# Patient Record
Sex: Female | Born: 1935 | Race: White | Hispanic: No | Marital: Married
Health system: Southern US, Community
[De-identification: ages and names within clinical notes are randomized; demographics above are authoritative.]

---

## 2006-03-10 ENCOUNTER — Ambulatory Visit: Payer: Self-pay | Admitting: Internal Medicine

## 2007-10-03 ENCOUNTER — Inpatient Hospital Stay: Payer: Self-pay | Admitting: Internal Medicine

## 2007-10-03 ENCOUNTER — Other Ambulatory Visit: Payer: Self-pay

## 2009-11-14 ENCOUNTER — Ambulatory Visit: Payer: Self-pay | Admitting: Unknown Physician Specialty

## 2014-09-26 ENCOUNTER — Inpatient Hospital Stay: Payer: Self-pay | Admitting: Internal Medicine

## 2014-09-26 LAB — PROTIME-INR
INR: 1
PROTHROMBIN TIME: 13.1 s (ref 11.5–14.7)

## 2014-09-26 LAB — COMPREHENSIVE METABOLIC PANEL
ANION GAP: 11 (ref 7–16)
Albumin: 2.3 g/dL — ABNORMAL LOW (ref 3.4–5.0)
Alkaline Phosphatase: 85 U/L (ref 46–116)
BUN: 19 mg/dL — AB (ref 7–18)
Bilirubin,Total: 1.2 mg/dL — ABNORMAL HIGH (ref 0.2–1.0)
CREATININE: 1.57 mg/dL — AB (ref 0.60–1.30)
Calcium, Total: 8.2 mg/dL — ABNORMAL LOW (ref 8.5–10.1)
Chloride: 97 mmol/L — ABNORMAL LOW (ref 98–107)
Co2: 28 mmol/L (ref 21–32)
EGFR (African American): 40 — ABNORMAL LOW
EGFR (Non-African Amer.): 33 — ABNORMAL LOW
GLUCOSE: 125 mg/dL — AB (ref 65–99)
OSMOLALITY: 276 (ref 275–301)
Potassium: 3.1 mmol/L — ABNORMAL LOW (ref 3.5–5.1)
SGOT(AST): 24 U/L (ref 15–37)
SGPT (ALT): 14 U/L (ref 14–63)
SODIUM: 136 mmol/L (ref 136–145)
TOTAL PROTEIN: 5.9 g/dL — AB (ref 6.4–8.2)

## 2014-09-26 LAB — URINALYSIS, COMPLETE
BILIRUBIN, UR: NEGATIVE
Blood: NEGATIVE
Glucose,UR: NEGATIVE mg/dL (ref 0–75)
Hyaline Cast: 8
KETONE: NEGATIVE
LEUKOCYTE ESTERASE: NEGATIVE
Nitrite: NEGATIVE
Ph: 5 (ref 4.5–8.0)
Protein: NEGATIVE
RBC,UR: 2 /HPF (ref 0–5)
SPECIFIC GRAVITY: 1.02 (ref 1.003–1.030)
Squamous Epithelial: <1

## 2014-09-26 LAB — CBC WITH DIFFERENTIAL/PLATELET
Basophil #: 0 10*3/uL (ref 0.0–0.1)
Basophil %: 0.4 %
Eosinophil #: 0 10*3/uL (ref 0.0–0.7)
Eosinophil %: 0.3 %
HCT: 33.7 % — AB (ref 35.0–47.0)
HGB: 10.6 g/dL — ABNORMAL LOW (ref 12.0–16.0)
Lymphocyte #: 0.7 10*3/uL — ABNORMAL LOW (ref 1.0–3.6)
Lymphocyte %: 9.6 %
MCH: 22.1 pg — AB (ref 26.0–34.0)
MCHC: 31.6 g/dL — ABNORMAL LOW (ref 32.0–36.0)
MCV: 70 fL — AB (ref 80–100)
MONO ABS: 0.4 x10 3/mm (ref 0.2–0.9)
Monocyte %: 5.7 %
Neutrophil #: 6.4 10*3/uL (ref 1.4–6.5)
Neutrophil %: 84 %
PLATELETS: 285 10*3/uL (ref 150–440)
RBC: 4.81 10*6/uL (ref 3.80–5.20)
RDW: 20 % — ABNORMAL HIGH (ref 11.5–14.5)
WBC: 7.6 10*3/uL (ref 3.6–11.0)

## 2014-09-26 LAB — APTT: Activated PTT: 23.9 secs (ref 23.6–35.9)

## 2014-09-27 LAB — CBC WITH DIFFERENTIAL/PLATELET
Basophil #: 0 10*3/uL (ref 0.0–0.1)
Basophil %: 0.4 %
Eosinophil #: 0 10*3/uL (ref 0.0–0.7)
Eosinophil %: 0.6 %
HCT: 26.4 % — AB (ref 35.0–47.0)
HGB: 8.3 g/dL — AB (ref 12.0–16.0)
LYMPHS PCT: 9.3 %
Lymphocyte #: 0.7 10*3/uL — ABNORMAL LOW (ref 1.0–3.6)
MCH: 22.2 pg — AB (ref 26.0–34.0)
MCHC: 31.4 g/dL — ABNORMAL LOW (ref 32.0–36.0)
MCV: 71 fL — ABNORMAL LOW (ref 80–100)
MONO ABS: 0.3 x10 3/mm (ref 0.2–0.9)
Monocyte %: 3.5 %
NEUTROS ABS: 6.2 10*3/uL (ref 1.4–6.5)
Neutrophil %: 86.2 %
Platelet: 245 10*3/uL (ref 150–440)
RBC: 3.74 10*6/uL — ABNORMAL LOW (ref 3.80–5.20)
RDW: 20.2 % — ABNORMAL HIGH (ref 11.5–14.5)
WBC: 7.2 10*3/uL (ref 3.6–11.0)

## 2014-09-27 LAB — BASIC METABOLIC PANEL
ANION GAP: 10 (ref 7–16)
BUN: 24 mg/dL — ABNORMAL HIGH (ref 7–18)
CHLORIDE: 107 mmol/L (ref 98–107)
Calcium, Total: 7.3 mg/dL — ABNORMAL LOW (ref 8.5–10.1)
Co2: 25 mmol/L (ref 21–32)
Creatinine: 1.91 mg/dL — ABNORMAL HIGH (ref 0.60–1.30)
EGFR (Non-African Amer.): 27 — ABNORMAL LOW
GFR CALC AF AMER: 32 — AB
Glucose: 85 mg/dL (ref 65–99)
OSMOLALITY: 286 (ref 275–301)
Potassium: 3 mmol/L — ABNORMAL LOW (ref 3.5–5.1)
Sodium: 142 mmol/L (ref 136–145)

## 2014-09-28 LAB — BASIC METABOLIC PANEL
Anion Gap: 11 (ref 7–16)
BUN: 24 mg/dL — AB (ref 7–18)
CALCIUM: 7.5 mg/dL — AB (ref 8.5–10.1)
Chloride: 115 mmol/L — ABNORMAL HIGH (ref 98–107)
Co2: 19 mmol/L — ABNORMAL LOW (ref 21–32)
Creatinine: 1.51 mg/dL — ABNORMAL HIGH (ref 0.60–1.30)
EGFR (African American): 42 — ABNORMAL LOW
EGFR (Non-African Amer.): 35 — ABNORMAL LOW
GLUCOSE: 86 mg/dL (ref 65–99)
OSMOLALITY: 292 (ref 275–301)
Potassium: 3.6 mmol/L (ref 3.5–5.1)
Sodium: 145 mmol/L (ref 136–145)

## 2014-09-28 LAB — CBC WITH DIFFERENTIAL/PLATELET
BASOS ABS: 0 10*3/uL (ref 0.0–0.1)
Basophil %: 0.3 %
Eosinophil #: 0 10*3/uL (ref 0.0–0.7)
Eosinophil %: 0.1 %
HCT: 23.2 % — ABNORMAL LOW (ref 35.0–47.0)
HGB: 7.1 g/dL — ABNORMAL LOW (ref 12.0–16.0)
LYMPHS PCT: 4.6 %
Lymphocyte #: 0.3 10*3/uL — ABNORMAL LOW (ref 1.0–3.6)
MCH: 22.1 pg — ABNORMAL LOW (ref 26.0–34.0)
MCHC: 30.8 g/dL — AB (ref 32.0–36.0)
MCV: 72 fL — ABNORMAL LOW (ref 80–100)
MONO ABS: 0.2 x10 3/mm (ref 0.2–0.9)
MONOS PCT: 3.1 %
NEUTROS PCT: 91.9 %
Neutrophil #: 5.3 10*3/uL (ref 1.4–6.5)
Platelet: 263 10*3/uL (ref 150–440)
RBC: 3.23 10*6/uL — ABNORMAL LOW (ref 3.80–5.20)
RDW: 20.8 % — AB (ref 11.5–14.5)
WBC: 5.7 10*3/uL (ref 3.6–11.0)

## 2014-09-29 LAB — BASIC METABOLIC PANEL
Anion Gap: 9 (ref 7–16)
BUN: 28 mg/dL — AB (ref 7–18)
CALCIUM: 7.9 mg/dL — AB (ref 8.5–10.1)
CHLORIDE: 115 mmol/L — AB (ref 98–107)
CO2: 21 mmol/L (ref 21–32)
Creatinine: 1.85 mg/dL — ABNORMAL HIGH (ref 0.60–1.30)
EGFR (African American): 33 — ABNORMAL LOW
EGFR (Non-African Amer.): 28 — ABNORMAL LOW
Glucose: 81 mg/dL (ref 65–99)
OSMOLALITY: 293 (ref 275–301)
Potassium: 3.8 mmol/L (ref 3.5–5.1)
Sodium: 145 mmol/L (ref 136–145)

## 2014-09-29 LAB — CBC WITH DIFFERENTIAL/PLATELET
BANDS NEUTROPHIL: 2 %
HCT: 28.2 % — ABNORMAL LOW (ref 35.0–47.0)
HGB: 9.1 g/dL — ABNORMAL LOW (ref 12.0–16.0)
LYMPHS PCT: 9 %
MCH: 23.7 pg — ABNORMAL LOW (ref 26.0–34.0)
MCHC: 32.1 g/dL (ref 32.0–36.0)
MCV: 74 fL — ABNORMAL LOW (ref 80–100)
Monocytes: 1 %
NRBC/100 WBC: 8 /
PLATELETS: 257 10*3/uL (ref 150–440)
RBC: 3.82 10*6/uL (ref 3.80–5.20)
RDW: 20.9 % — AB (ref 11.5–14.5)
SEGMENTED NEUTROPHILS: 88 %
WBC: 5.6 10*3/uL (ref 3.6–11.0)

## 2014-09-30 LAB — CBC WITH DIFFERENTIAL/PLATELET
HCT: 28.7 % — ABNORMAL LOW (ref 35.0–47.0)
HGB: 9 g/dL — ABNORMAL LOW (ref 12.0–16.0)
Lymphocytes: 10 %
MCH: 23.6 pg — ABNORMAL LOW (ref 26.0–34.0)
MCHC: 31.4 g/dL — ABNORMAL LOW (ref 32.0–36.0)
MCV: 75 fL — ABNORMAL LOW (ref 80–100)
Monocytes: 3 %
NRBC/100 WBC: 5 /
Platelet: 285 10*3/uL (ref 150–440)
RBC: 3.83 10*6/uL (ref 3.80–5.20)
RDW: 21.7 % — ABNORMAL HIGH (ref 11.5–14.5)
Segmented Neutrophils: 87 %
WBC: 5.5 10*3/uL (ref 3.6–11.0)

## 2014-09-30 LAB — BASIC METABOLIC PANEL
Anion Gap: 13 (ref 7–16)
BUN: 34 mg/dL — ABNORMAL HIGH (ref 7–18)
CALCIUM: 7.9 mg/dL — AB (ref 8.5–10.1)
CHLORIDE: 118 mmol/L — AB (ref 98–107)
CO2: 17 mmol/L — AB (ref 21–32)
CREATININE: 1.65 mg/dL — AB (ref 0.60–1.30)
EGFR (African American): 38 — ABNORMAL LOW
EGFR (Non-African Amer.): 31 — ABNORMAL LOW
Glucose: 73 mg/dL (ref 65–99)
Osmolality: 300 (ref 275–301)
Potassium: 4.1 mmol/L (ref 3.5–5.1)
Sodium: 148 mmol/L — ABNORMAL HIGH (ref 136–145)

## 2014-09-30 LAB — CLOSTRIDIUM DIFFICILE(ARMC)

## 2014-10-01 ENCOUNTER — Ambulatory Visit: Payer: Self-pay | Admitting: Internal Medicine

## 2014-10-01 LAB — CREATININE, SERUM
Creatinine: 1.51 mg/dL — ABNORMAL HIGH (ref 0.60–1.30)
GFR CALC AF AMER: 42 — AB
GFR CALC NON AF AMER: 35 — AB

## 2014-10-30 ENCOUNTER — Ambulatory Visit
Admit: 2014-10-30 | Disposition: A | Discharge status: Home / Self Care | Payer: Self-pay | Attending: Internal Medicine | Admitting: Internal Medicine

## 2014-10-30 DEATH — deceased

## 2014-12-30 NOTE — Op Note (Signed)
PATIENT NAME:  Michelle GantDAMS, Teanna W MR#:  [id number] DATE OF BIRTH:  Apr 28, 1927  DATE OF PROCEDURE:  09/27/2014  PREOPERATIVE DIAGNOSIS: Right displaced intertrochanteric hip fracture.  POSTOPERATIVE DIAGNOSIS: Right displaced intertrochanteric hip fracture.   PROCEDURE: Intramedullary fixation of right intertrochanteric hip fracture.   ANESTHESIA: Spinal with 0.5% Marcaine local.   SURGEON: Juanell FairlyKevin Stacey Maura, MD   ESTIMATED BLOOD LOSS: 100 mL.   COMPLICATIONS: None.   IMPLANTS: A [employer] Affixus 11 x 180 mm, 125 degree short intramedullary rod with a 10.5 x 95 mm lag screw and a 5 x 38 mm cortical bone screw for distal fixation.   INDICATIONS FOR PROCEDURE: The patient is an 81 year old female with advanced dementia who sustained a fall from her couch. The fall was unwitnessed The family found her on the ground and when they tried to help her back onto the couch the patient had significant pain in the right lower extremity. She was brought to the [facility] where x-rays were taken. The patient was diagnosed with a displaced intertrochanteric hip fracture. The patient was reported by the family to be ambulatory at baseline.   I recommended intramedullary fixation, given the fracture pattern. I recommended to the family surgical fixation to help with the patient's pain control and to give her a chance to return to standing and ambulation. I reviewed the risks and benefits with the patient's son and her husband on the evening of admission. They understood the risks include infection, bleeding requiring blood transfusion, nerve or blood vessel injury, malunion, nonunion, hardware failure, painful hardware, change in lower extremity rotation or leg length discrepancy, persistent hip pain and the need for further surgery. Medical risks include, but are not limited to DVT and pulmonary embolism, myocardial infarction, stroke, respiratory failure and death. The patient's family  understood these risks and wished to proceed. Her son signed the consent form for surgery and for blood transfusion.   PROCEDURE NOTE: The patient had the right hip marked with the word "yes" and my initials, according to the hospital's correct site surgery protocol. She was brought to the operating room where she underwent a spinal anesthetic by the anesthesia service. She was then placed supine on the fracture table. All bony prominences were adequately padded. A timeout was performed to verify the patient's name, date of birth, medical record number, correct site of surgery and correct procedure to be performed. It was also used to verify the patient had received antibiotics and appropriate instruments, implants, and radiographic studies were available in the room. Once all in attendance were in agreement, the case began. Longitudinal traction and internal rotation was applied to the right leg, which was in a leg holder, with the hip in extension. The left leg had been placed in a well leg holder in a hemi-lithotomy position. The fracture reduction was confirmed on AP and lateral C-arm images. Once near-anatomic reduction was achieved, the patient was prepped and draped in a sterile fashion. A longitudinal incision just superior to the tip of the greater trochanter was then made in line with the femur. The deep fascia was incised with a deep #10 blade and the abductor muscle was bluntly split allowing for palpation of the tip of the greater trochanter. A threaded guide pin was then advanced into the tip of the greater trochanter and into the proximal femur to the level of the lesser trochanter. The position of this guidepin was confirmed on AP and lateral C-arm images. A soft tissue protector was used  as this guide pin was overdrilled with a proximal femoral drill. This allowed for placement of an Affixus 11 x 180 mm, 125 degree short intramedullary rod which was then advanced into position. Its position was  confirmed on AP and lateral C-arm images. A drill sleeve for the lag screw was then placed through the Affixus guide arm. A small incision was made to allow for advancement of the drill sleeve along the lateral cortex of the femur. A threaded guide pin was then advanced through the lateral cortex of the femur, across the fracture site and into the femoral head. Its position was confirmed on AP and lateral C-arm images. A tip apex distance of less than 25 mm was achieved. This was then overdrilled after the lag screw was measured to 95 mm. The lag screw drill was advanced to 95 mm of depth. The lag screw was then advanced by hand into position and across the fracture and into the femoral head. Again, a tip apex distance of less than 25 mm was achieved. Final position of the lag screw was confirmed on AP and lateral C-arm images. Compression was applied to the lag screw to compress the fracture. A set screw at the top of the nail was then tightened and then turned counterclockwise a quarter turn to allow for further compression of the lag screw during weightbearing. Finally, the attention was turned to placement of the distal interlocking screw. The drill guide for the distal screw was placed through the guide arm. A small stab incision was made to allow for placement of this drill sleeve along the lateral cortex of the femur. The distal interlocking drill bit was then advanced bicortically. It was measured to be 38 mm in length. A 30 mm lag screw was then advanced into position by hand through the drill sleeve and hand tightened. The position of this lag screw was confirmed on AP and lateral C-arm images. The guide arm for the Affixus nail was then removed. Final images of the intramedullary construct were taken with the C-arm in both the AP and lateral planes. All wounds were copiously irrigated. The deep fascia of the proximal incision was closed using an interrupted 0 Vicryl. The subcutaneous tissue of all 3  incisions was approximated using a 2-0 Vicryl and the skin was approximated using staples. A dry sterile dressing was applied. The patient was then transferred to a hospital bed and brought to the PACU in stable condition. I was scrubbed and present for the entire case, and all sharp and instrument counts were correct at the conclusion of the case. I spoke with the patient's family postoperatively to let them know the case had gone without complication and the patient was stable in the recovery room.   ____________________________ Kathreen Devoid, MD klk:TT D: 10/02/2014 16:25:11 ET T: 10/02/2014 16:51:46 ET JOB#: [id number]  cc: Kathreen Devoid, MD, <Dictator> Kathreen Devoid MD ELECTRONICALLY SIGNED 10/08/2014 18:23

## 2014-12-30 NOTE — Consult Note (Signed)
Brief Consult Note: Diagnosis: Right intertrochanteric hip fracture.   Patient was seen by consultant.   Consult note dictated.   Recommend to proceed with surgery or procedure.   Orders entered.   Comments: Patient has a right intertrochanteric hip fracture.  She is scheduled for surgery tomorrow.  Patient cleared by medicine.  Electronic Signatures: Juanell FairlyKrasinski, Sheetal Lyall (MD)  (Signed 27-Jan-16 18:50)  Authored: Brief Consult Note   Last Updated: 27-Jan-16 18:50 by Juanell FairlyKrasinski, Chemeka Filice (MD)

## 2014-12-30 NOTE — Discharge Summary (Signed)
PATIENT NAME:  Michelle Ferguson, Michelle Ferguson MR#:  [id number] DATE OF BIRTH:  [birth year]  DATE OF ADMISSION:  09/26/2014 DATE OF DISCHARGE:  10/02/2014  ADMITTING DIAGNOSIS: Status post fall with right hip pain.   DISCHARGE DIAGNOSES:  1.  Status post fall with right hip pain due to a right hip fracture status post open reduction internal fixation.  2.  Acute encephalopathy as a result of her fracture and dementia.  3.  Dementia with behavioral disturbances including agitation.  4.  Coronary artery disease.  5.  Acute blood loss anemia status post transfusion.  6.  Acute renal failure due to poor p.o. intake.  7.  Failure to thrive.   HOSPITAL COURSE: Please refer to H and P done by the admitting physician. The patient is an 81 year old white female who has dementia, who also has COPD, coronary artery disease, who presented with a fall at home. The patient was admitted to the hospital and was seen by orthopedics, had ORIF of the hip. The patient postoperative did not do too well. She continued not to eat and drink much. Was given IV fluids and then after receiving IV fluids she started retaining fluid.  She also had acute renal failure due to poor p.o. intake. The patient's condition did not improve, therefore discussions were held with palliative care and the family. She was DNR, however they wanted to initially take her home with home hospice. However due to her deteriorating condition the patient was made complete comfort care and was transferred to hospice home.   MEDICATIONS:  Oxycodone 2 mL q. 4 p.r.n. for pain, Ativan 1 mg q. 4 p.r.n. for anxiety.   DIET: As tolerated.   ACTIVITY: As tolerated.    TIME SPENT: 35 minutes.     ____________________________ Lacie ScottsShreyang H. Allena KatzPatel, MD shp:bu D: 10/03/2014 13:09:13 ET T: 10/03/2014 15:13:05 ET JOB#: [id number]  cc: Pamalee Marcoe H. Allena KatzPatel, MD, <Dictator> Charise CarwinSHREYANG H Wendal Wilkie MD ELECTRONICALLY SIGNED 10/24/2014 12:36

## 2014-12-30 NOTE — Consult Note (Signed)
PATIENT NAME:  Michelle Ferguson, Michelle Ferguson MR#:  [id number] DATE OF BIRTH:  Dec 20, 1926  DATE OF CONSULTATION:  09/26/2014  REFERRING PHYSICIAN:   CONSULTING PHYSICIAN:  Kathreen DevoidKevin L. Tytianna Greenley, MD  ORTHOPEDIC CONSULTATION:   REASON FOR CONSULTATION: Right intratrochanteric hip fracture.   HISTORY: Michelle Ferguson is an 81 year old female with a history of CABG, chronic obstructive pulmonary disease and osteoporosis along with advanced dementia who was brought to the [facility] after sustaining a fall at home. The patient is reported to have been sitting on couch at home. She fell onto the floor. This was unwitnessed; however, the patient had significant pain and deformity of the right when the family tried to help her up off the floor. She was brought to the [facility] for further evaluation. The patient has advanced dementia and is unable to provide history. The history is obtained from the patient's son, and husband, along with the admission H and P.   PAST MEDICAL HISTORY: Includes hyperlipidemia, osteoporosis, COPD, prior spine surgery and CABG in the 1990s.   ALLERGIES: No known drug allergies.   SOCIAL HISTORY: The patient does not smoke, use alcohol or drugs. She lives at home with her family, and ambulates at baseline with a cane.   HOME MEDICATIONS: Include Zocor 20 mg p.o. daily.   PHYSICAL EXAMINATION:  GENERAL: The patient is lying in bed.  EXTREMITIES: Her right leg is in Buck's traction. Her skin overlying the right hip is intact. There is no erythema, ecchymosis or swelling. Her thigh and leg compartments are soft and compressible. She has palpable pedal pulses, spontaneous movement of her right toes; she is shortened and externally rotated. Sensation cannot be assessed given the patient's dementia.   RADIOLOGY: X-ray films of the right hip demonstrate a displaced intertrochanteric hip fracture with shortening and external rotation. There is  comminution at the fracture site.   ASSESSMENT: Right displaced intertrochanteric hip fracture.   PLAN: I reviewed with the patient's family, the nature of her fracture. I have recommended an intramedullary fixation for this fracture with an intramedullary rod. I explained the details of surgery along with the postoperative course. I reviewed the risks and benefits of surgery with the patient's family. They understand the risks include infection, bleeding, nerve or blood vessel injury, change in leg length or change in lower extremity rotation, malunion, nonunion, persistent pain and the need for further surgery. Medical risks include, but are not limited to DVT and pulmonary embolism, myocardial infarction, stroke, pneumonia, respiratory failure and death. The family understood these risks. I reviewed with them the blood consent as well. Both consents were signed by the family. The patient has been cleared by medicine for surgery tomorrow. I reviewed all radiographic and labs studies in preparation for this case. The patient will be n.p.o. after midnight.    ____________________________ Kathreen DevoidKevin L. Foy Vanduyne, MD klk:nt D: 09/26/2014 21:16:55 ET T: 09/26/2014 22:35:21 ET JOB#: [id number]  cc: Kathreen DevoidKevin L. Sybil Shrader, MD, <Dictator> Kathreen DevoidKEVIN L Nichola Cieslinski MD ELECTRONICALLY SIGNED 10/08/2014 18:23

## 2014-12-30 NOTE — H&P (Signed)
PATIENT NAME:  Michelle Ferguson, Michelle Ferguson MR#:  [id number] DATE OF BIRTH:  1926/12/24  DATE OF ADMISSION:  09/26/2014  PRIMARY CARE PHYSICIAN:  Kandyce RudMarcus Babaoff, MD   CHIEF COMPLAINT: Right hip pain.   HISTORY OF PRESENT ILLNESS: An 81 year old Caucasian female patient with history of CABG, COPD, osteoporosis and advanced dementia presents to the Emergency Room after a fall. The patient was sitting on a couch and her family heard a noise. When they went to see the patient she was down on the floor.  They lifted her up and put her on the couch but noticed that she had deformity and pain in her right hip and brought her to the Emergency Room.   Here, patient has been found to have a right femur/hip fracture needing surgery. The patient is being admitted to the hospital.   The patient is unable to give any history. She has advanced dementia but seems to be comfortable in no pain at this point. Functional status could not be assessed but she does not complain of any shortness of breath or chest pain when she ambulates per family. She has not had any recent testing. She was last seen by her primary care physician 2 months prior with no change in medications and everything checked out okay at that point. Presently, she is only taking Zocor at home, all her other medications have been stopped.   The patient has not had any recent falls, or fractures. She never had any complications with anesthesia.   PAST MEDICAL HISTORY: 1.  Hyperlipidemia.  2.  Osteoporosis.  3.  COPD.  4.  Back surgeries.  5.  CABG in the 1990s.   ALLERGIES: No known drug allergies.   SOCIAL HISTORY: The patient does not smoke. No alcohol. No illicit drugs. Lives at home with family. Ambulates with a cane. Has advanced dementia.   CODE STATUS: DNR/DNI, next of kin is her husband, her decision maker.   REVIEW OF SYSTEMS: Unobtainable secondary to the patient's advanced dementia.   HOME MEDICATIONS: Are Zocor 20 mg oral daily.    FAMILY HISTORY: Reviewed, but unknown.   PHYSICAL EXAMINATION: VITAL SIGNS: Shows temperature 97.4, pulse of 102, blood pressure 103/67, saturating 100% on room air.  GENERAL: Elderly, thin, Caucasian female patient lying in bed in no distress but presently confused.  PSYCHIATRIC: Alert, awake, not oriented.  HEENT: Atraumatic, normocephalic; oral mucosa moist and pink, extraocular movements normal.  Has pallor positive. No icterus; pupils bilaterally equal and reactive to light.  NECK: Supple. No thyromegaly. No palpable lymph nodes, trachea midline, no carotid bruits or JVD.  CARDIOVASCULAR: S1, S2, tachycardic without any murmurs, peripheral pulses 2+, no edema.  RESPIRATORY: Normal work of breathing. Clear to auscultation on both sides.  GASTROINTESTINAL: Soft abdomen, nontender; bowel sounds present, no organomegaly palpable.  SKIN: Warm and dry. No petechiae, rash, or ulcers.  MUSCULOSKELETAL: No joint swelling, redness, effusion of the large joints, normal muscle tone; has pain on movement of the right hip.  NEUROLOGICAL: Seems to move all extremities symmetrically, does not follow commands; Babinski's downgoing, reflexes 2+.  LYMPHATIC: No cervical lymphadenopathy.   DIAGNOSTIC DATA:  1.  Show glucose of 125, BUN 19, creatinine 1.57, sodium 136, potassium 3.1, chloride 97, bicarbonate 28, GFR 33; AST, ALT, alkaline phosphatase, bilirubin normal; WBC 7.6, hemoglobin 10.6, platelets of 285,000, MCV 70; INR 1.  2.  EKG shows a normal sinus rhythm with no acute ST elevation found, does have nonspecific ST-T wave changes.  3.  X-ray of the right hip shows acute comminuted intertrochanteric right femur fracture with varus impaction.  4.  Chest x-ray, portable, shows no acute cardiopulmonary disease, has changes from prior CABG; has multilevel chronic left posterior rib fractures and multilevel spinal compression fractures.   ASSESSMENT AND PLAN: 1.  Right hip fracture in a patient with  coronary artery disease, needing surgery. The patient is not on any beta blockers, aspirin at home. Does have multiple comorbidities, it is difficult to assess her functional status, but has not had any recent issues. Can proceed with surgery as she does need the hip surgery; she will be at moderate risk; this has been discussed with the family. We will monitor for any complications; deep vein prophylaxis and physical therapy recommendations per orthopedics, Dr. Martha Clan.  2.  Coronary artery disease. No beta blocker at this time, as patient has low normal blood pressure. Aspirin can be started after surgery. No acute issues.  3.  Advanced dementia. The patient is high risk for inpatient delirium.  4.  Deep vein thrombosis prophylaxis per orthopedics.   CODE STATUS: DNR/DNI, as discussed with husband and son at bedside.   Time spent today on this case was 45 minutes.    ____________________________ Molinda Bailiff Cissy Galbreath, MD srs:nt D: 09/26/2014 16:18:15 ET T: 09/26/2014 16:40:03 ET JOB#: [id number]  cc: Wardell Heath R. Elpidio Anis, MD, <Dictator> Kandyce Rud, MD Kathreen Devoid, MD Orie Fisherman MD ELECTRONICALLY SIGNED 10/08/2014 3:54

## 2016-06-09 IMAGING — CR DG CHEST 1V
1 series · 1 of 1 positions shown · non-contrast
Comparison: Chest radiograph 10/03/2007.

CLINICAL DATA: 87-year-old female preoperative study for hip
fracture. Initial encounter.

EXAM:
CHEST - 1 VIEW

[ap]
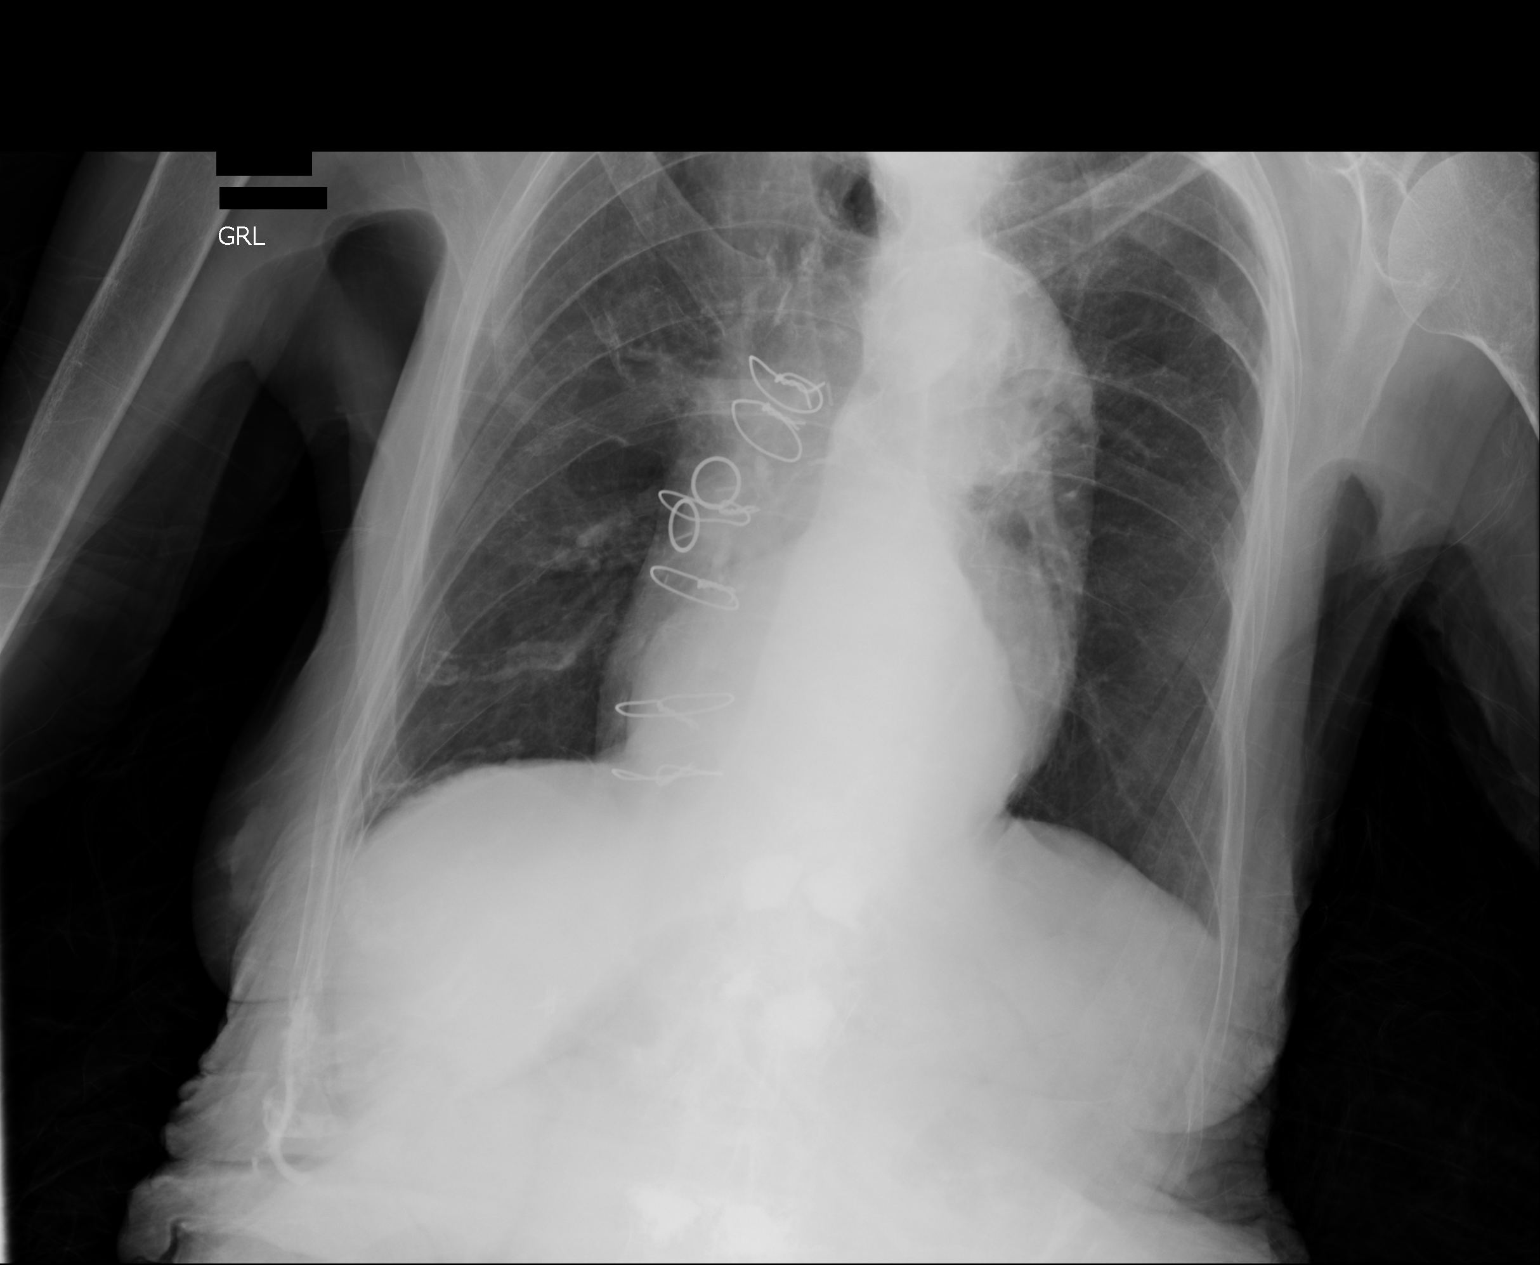

[1 of 1 positions shown; findings below may reference images not displayed]

FINDINGS: Portable AP upright view at 9119 hrs. Sequelae of CABG. stable
mediastinal contour, allowing for patient rotation to the right
today. No pneumothorax, pulmonary edema, pleural effusion or
confluent pulmonary opacity. Multilevel chronic left posterior rib
fractures. Multilevel spinal compression fractures previously
augmented. Cholecystectomy clips.
IMPRESSION: No acute cardiopulmonary abnormality.

## 2016-06-10 IMAGING — CR PELVIS - 1-2 VIEW
1 series · 2 of 2 positions shown · non-contrast
Comparison: Intraoperative C-arm images from internal fixation
performed earlier today at [DATE] p.m.

CLINICAL DATA: Status post internal fixation of right femoral
intertrochanteric fracture. Subsequent encounter.

EXAM:
PELVIS - 1-2 VIEW

[Series 1: ap · 0.17mm/px · 2 of 2 slices shown]
[im 1/2]
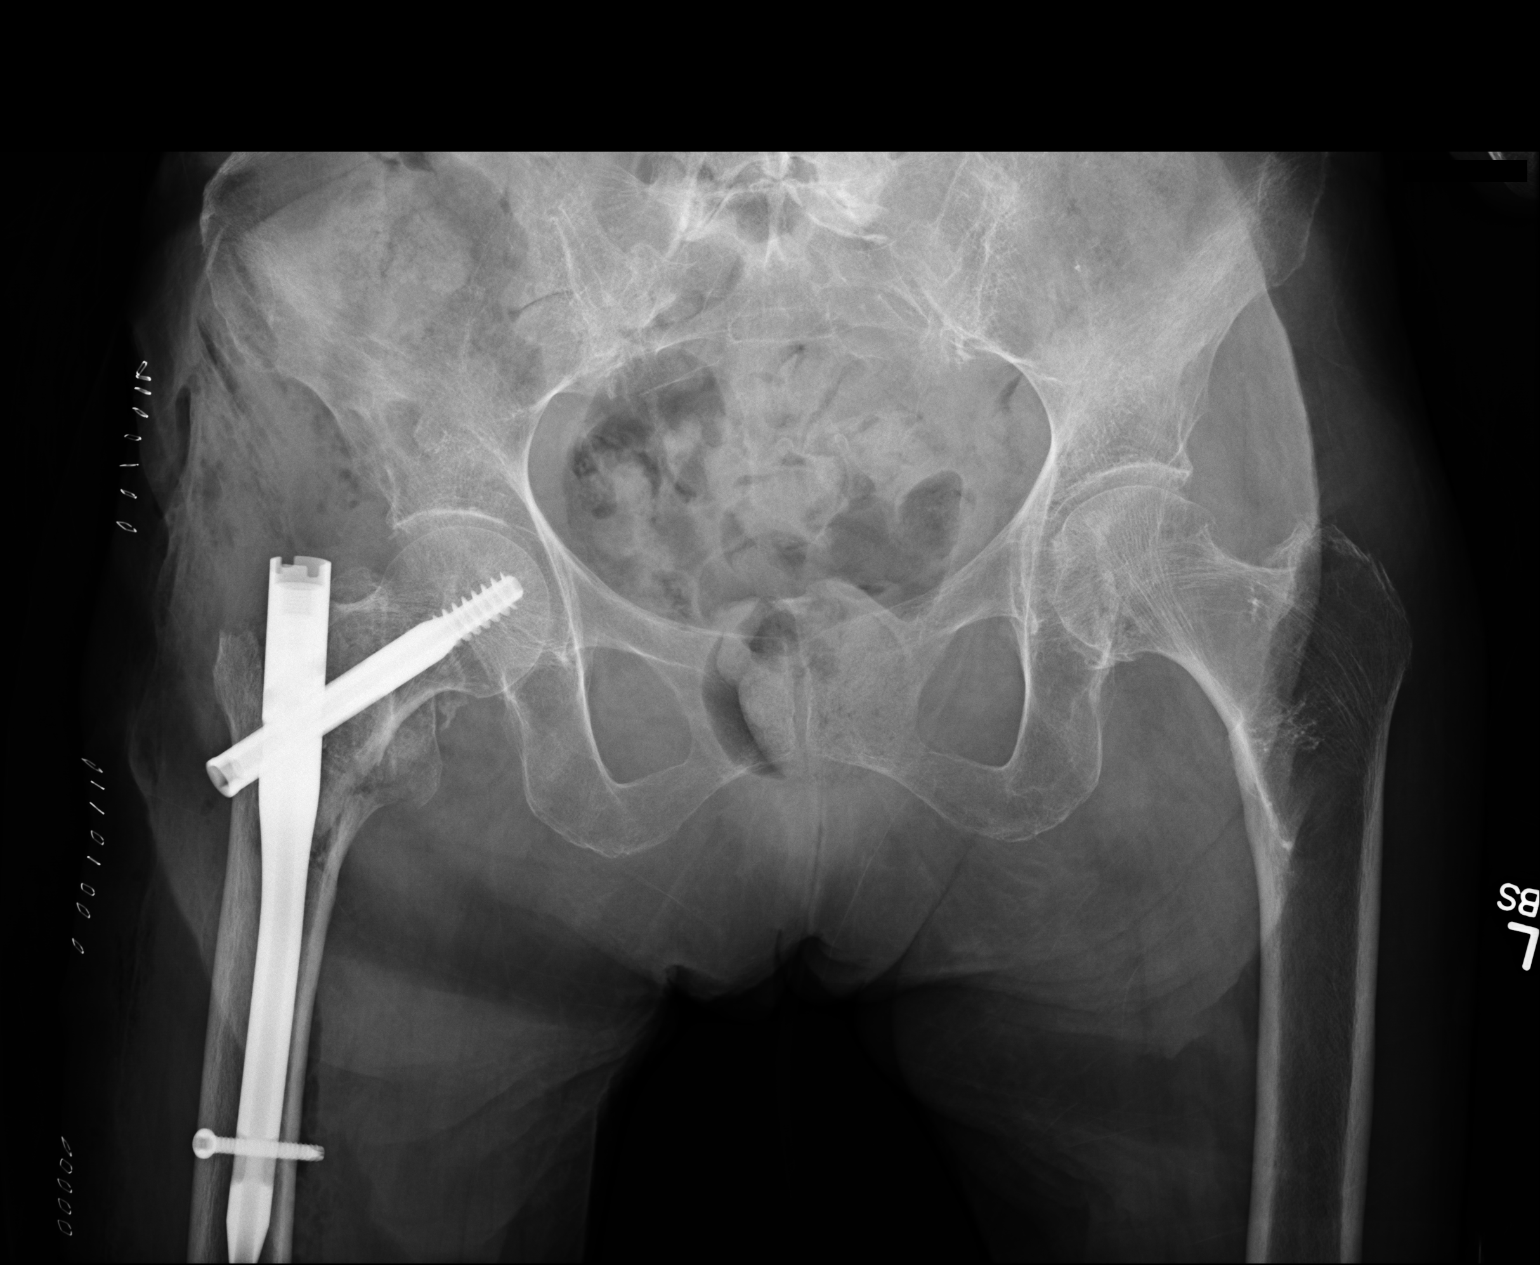
[im 2/2]
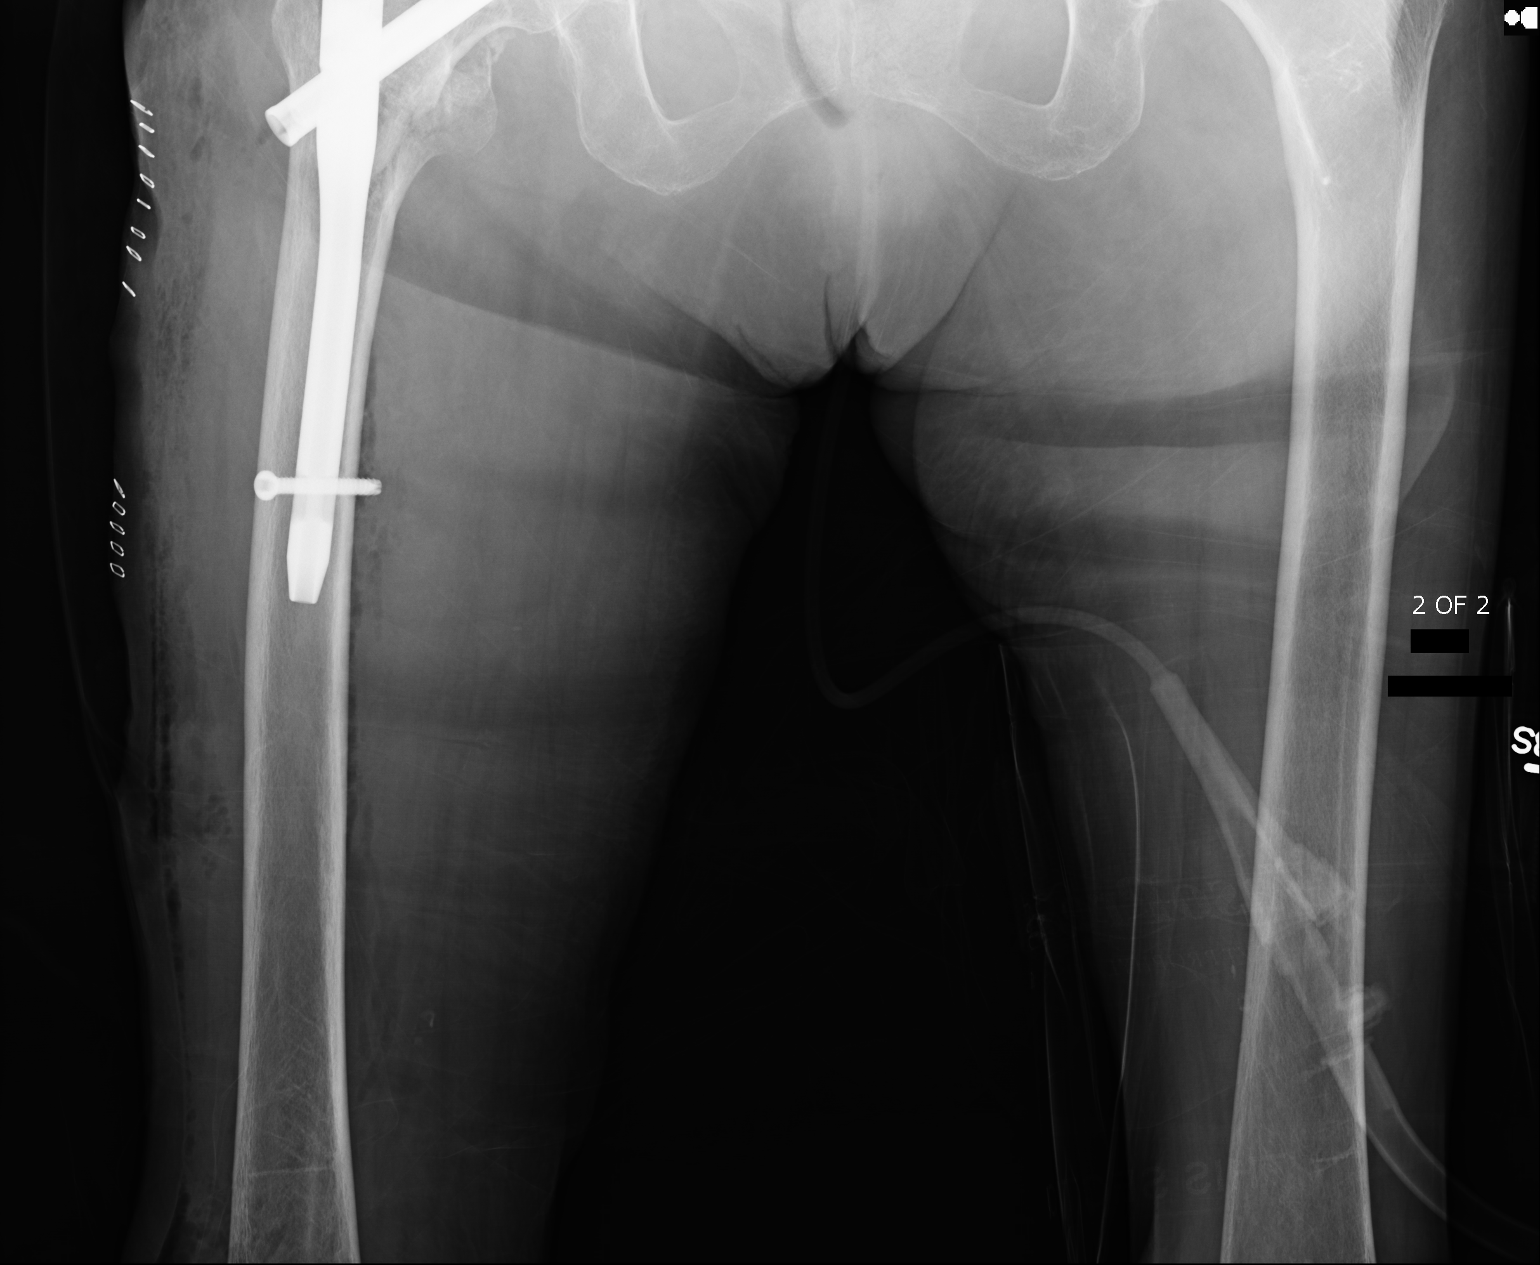

[2 of 2 positions shown; findings below may reference images not displayed]

FINDINGS: The patient is status post internal fixation of the comminuted right
femoral intertrochanteric fracture, noted in near-anatomic
alignment. No new fractures are seen. The right femoral head remains
seated at the acetabulum. The left hip joint is unremarkable in
appearance. Mild degenerative changes noted at the lower lumbar
spine, with changes of vertebroplasty partially imaged. The
sacroiliac joints are grossly unremarkable in appearance.

Soft tissue air is seen at the surgical site and extending
inferiorly along the right leg, with overlying skin staples. The
visualized bowel gas pattern is grossly unremarkable.
IMPRESSION: Status post internal fixation of comminuted right femoral
intertrochanteric fracture, noted in near-anatomic alignment.

## 2016-06-10 IMAGING — CR RIGHT HIP - 1 VIEW
1 series · 1 of 1 positions shown · non-contrast
Comparison: None.

CLINICAL DATA: Status post internal fixation of right femoral
intertrochanteric fracture. Subsequent encounter.

EXAM:
RIGHT HIP - 1 VIEW

[lat]
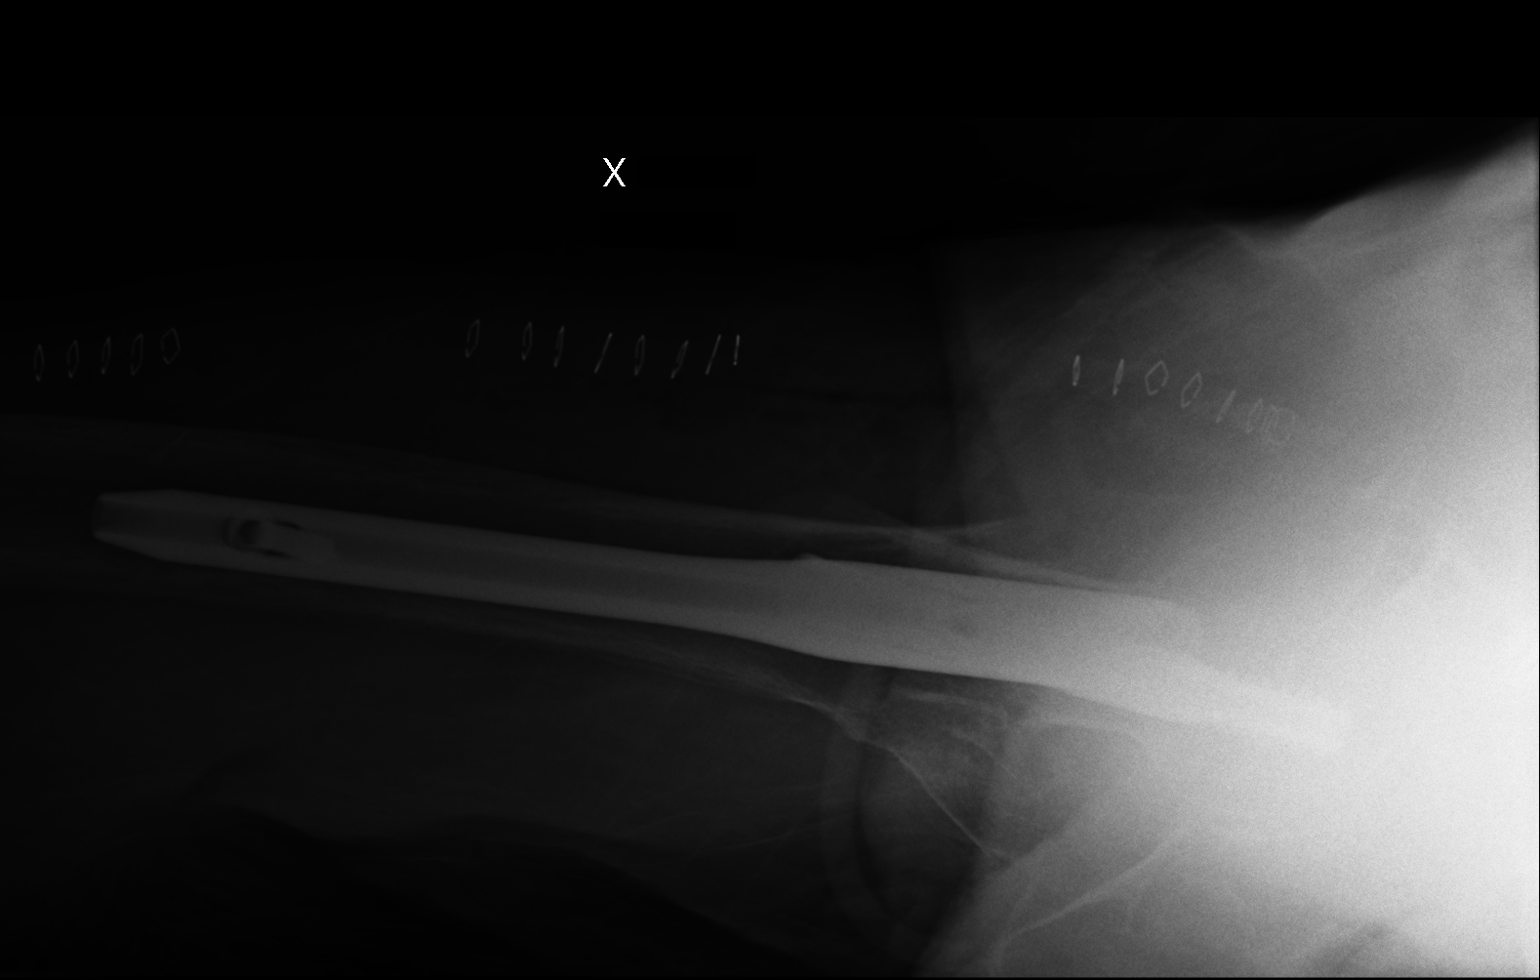

[1 of 1 positions shown; findings below may reference images not displayed]

FINDINGS: The patient is status post internal fixation of the comminuted right
femoral intertrochanteric fracture, noted in near anatomic alignment
on concurrent frontal views. No new fractures are seen. Overlying
skin staples are noted.
IMPRESSION: Status post internal fixation of comminuted right femoral
intertrochanteric fracture, seen in near anatomic alignment.

## 2020-06-18 ENCOUNTER — Other Ambulatory Visit: Payer: Self-pay | Admitting: Neurology

## 2020-06-18 ENCOUNTER — Other Ambulatory Visit (HOSPITAL_COMMUNITY): Payer: Self-pay | Admitting: Neurology

## 2020-06-18 DIAGNOSIS — R4182 Altered mental status, unspecified: Secondary | ICD-10-CM
